# Patient Record
Sex: Female | Born: 1960 | Race: Asian | Hispanic: No | Marital: Married | Smoking: Never smoker
Health system: Southern US, Community
[De-identification: ages and names within clinical notes are randomized; demographics above are authoritative.]

## PROBLEM LIST (undated history)

## (undated) DIAGNOSIS — G43909 Migraine, unspecified, not intractable, without status migrainosus: Secondary | ICD-10-CM

## (undated) DIAGNOSIS — K219 Gastro-esophageal reflux disease without esophagitis: Secondary | ICD-10-CM

## (undated) HISTORY — PX: TOOTH EXTRACTION: SUR596

## (undated) HISTORY — DX: Migraine, unspecified, not intractable, without status migrainosus: G43.909

## (undated) HISTORY — DX: Gastro-esophageal reflux disease without esophagitis: K21.9

---

## 2016-04-09 DIAGNOSIS — M542 Cervicalgia: Secondary | ICD-10-CM | POA: Insufficient documentation

## 2016-04-09 DIAGNOSIS — Z8719 Personal history of other diseases of the digestive system: Secondary | ICD-10-CM | POA: Insufficient documentation

## 2016-04-09 DIAGNOSIS — M25512 Pain in left shoulder: Secondary | ICD-10-CM | POA: Insufficient documentation

## 2016-04-09 HISTORY — DX: Pain in left shoulder: M25.512

## 2016-10-09 DIAGNOSIS — M7502 Adhesive capsulitis of left shoulder: Secondary | ICD-10-CM

## 2016-10-09 HISTORY — DX: Adhesive capsulitis of left shoulder: M75.02

## 2019-03-04 DIAGNOSIS — D259 Leiomyoma of uterus, unspecified: Secondary | ICD-10-CM | POA: Insufficient documentation

## 2019-03-04 DIAGNOSIS — K219 Gastro-esophageal reflux disease without esophagitis: Secondary | ICD-10-CM | POA: Insufficient documentation

## 2019-03-04 DIAGNOSIS — K5792 Diverticulitis of intestine, part unspecified, without perforation or abscess without bleeding: Secondary | ICD-10-CM | POA: Insufficient documentation

## 2019-03-04 DIAGNOSIS — E559 Vitamin D deficiency, unspecified: Secondary | ICD-10-CM | POA: Insufficient documentation

## 2019-03-04 DIAGNOSIS — K635 Polyp of colon: Secondary | ICD-10-CM | POA: Insufficient documentation

## 2019-03-04 DIAGNOSIS — Z9109 Other allergy status, other than to drugs and biological substances: Secondary | ICD-10-CM | POA: Insufficient documentation

## 2020-05-09 ENCOUNTER — Ambulatory Visit: Payer: 59 | Admitting: Family Medicine

## 2020-05-09 ENCOUNTER — Encounter: Payer: Self-pay | Admitting: Family Medicine

## 2020-05-09 ENCOUNTER — Other Ambulatory Visit: Payer: Self-pay

## 2020-05-09 VITALS — BP 110/70 | HR 84 | Ht 61.0 in | Wt 112.0 lb

## 2020-05-09 DIAGNOSIS — G43809 Other migraine, not intractable, without status migrainosus: Secondary | ICD-10-CM | POA: Diagnosis not present

## 2020-05-09 DIAGNOSIS — Z0000 Encounter for general adult medical examination without abnormal findings: Secondary | ICD-10-CM

## 2020-05-09 DIAGNOSIS — Z13220 Encounter for screening for lipoid disorders: Secondary | ICD-10-CM

## 2020-05-09 DIAGNOSIS — G43909 Migraine, unspecified, not intractable, without status migrainosus: Secondary | ICD-10-CM | POA: Insufficient documentation

## 2020-05-09 NOTE — Progress Notes (Signed)
Annual Exam   Chief Complaint:  Chief Complaint  Patient presents with  . Establish Care    History of Present Illness:  Ms. Diana Reyes is a 59 y.o. [id number] who LMP was No LMP recorded. Patient is postmenopausal., presents today for her annual examination.     Nutrition She does get adequate calcium and Vitamin D in her diet. Diet: varied, balanced Exercise: walking daily  Safety The patient wears seatbelts: yes.     The patient feels safe at home and in their relationships: yes.   Menstrual Post-menopausal Symptoms improving - hot flashes less often  GYN She is not sexually active.    Cervical Cancer Screening:   Last Pap:  Year ago No hx of abnormal Declines today  Breast Cancer Screening There is no FH of breast cancer. There is no FH of ovarian cancer. BRCA screening Not Indicated.  Last Mammogram: 2017 The patient does want a mammogram this year.    Colon Cancer Screening Age 68-75 yo - benefits outweigh the risk. Adults 42-85 yo who have never been screened benefit.  Benefits: 134000 people in 2016 will be diagnosed and 49,000 will die - early detection helps [place]: Complications 2/2 to colonoscopy High Risk (Colonoscopy): genetic disorder (Lynch syndrome or familial adenomatous polyposis), personal hx of IBD, previous adenomatous polyp, or previous colorectal cancer, FamHx start 10 years before the age at diagnosis, increased in males and black race  Options:  FIT - looks for hemoglobin (blood in the stool) - specific and fairly sensitive - must be done annually Cologuard - looks for DNA and blood - more sensitive - therefore can have more false positives, every 3 years Colonoscopy - every 10 years if normal - sedation, bowl prep, must have someone drive you  Shared decision making and the patient had decided to do up to date on colonscopy will repeat in 2026.   Lung Cancer Screening Annual screening for adults age 45-80 yo with 30 year pack history?  No Current Tobacco user? No Quit less than 15 years ago? No Interested in low dose CT for lung cancer screening? not applicable  Weight Wt Readings from Last 3 Encounters:  05/09/20 112 lb (50.8 kg)   Patient has normal BMI  BMI Readings from Last 1 Encounters:  05/09/20 21.16 kg/m     Chronic disease screening Blood pressure monitoring:  BP Readings from Last 3 Encounters:  05/09/20 110/70    Lipid Monitoring: Indication for screening: age >105, obesity, diabetes, family hx, CV risk factors.  Lipid screening: Yes  No results found for: CHOL, HDL, LDLCALC, LDLDIRECT, TRIG, CHOLHDL   Diabetes Screening: age >81, overweight, family hx, PCOS, hx of gestational diabetes, at risk ethnicity Diabetes Screening screening: Yes  No results found for: HGBA1C   Past Medical History:  Diagnosis Date  . Acute pain of left shoulder 04/09/2016  . Adhesive capsulitis of left shoulder 10/09/2016  . Chronic GERD   . Migraines     Past Surgical History:  Procedure Laterality Date  . TOOTH EXTRACTION      Prior to Admission medications   Medication Sig Start Date End Date Taking? Authorizing Provider  Calcium Carb-Cholecalciferol (CALCIUM/VITAMIN D PO) Take by mouth.   Yes [provider]  cetirizine (ZYRTEC) 10 MG tablet Take by mouth.   Yes [provider]  ibuprofen (ADVIL) 600 MG tablet Take 600 mg by mouth as needed.   Yes [provider]  Multiple Vitamin (MULTI-VITAMIN) tablet Take 1 tablet by mouth  daily.   Yes [provider]  omeprazole (PRILOSEC) 40 MG capsule Take by mouth as needed. 04/11/16  Yes [provider]    Allergies  Allergen Reactions  . Other Rash    Mild itchy rash occasionally with seafood    Gynecologic History: No LMP recorded. Patient is postmenopausal.  Obstetric History: [id number]  Social History   Socioeconomic History  . Marital status: Married    Spouse name: Jeris Penta  . Number of children: 2  .  Years of education: college  . Highest education level: Not on file  Occupational History  . Not on file  Tobacco Use  . Smoking status: Never Smoker  . Smokeless tobacco: Never Used  Vaping Use  . Vaping Use: Never used  Substance and Sexual Activity  . Alcohol use: Yes    Comment: 1-2 times a week, less than a serving size  . Drug use: Never  . Sexual activity: Not Currently    Partners: Male    Birth control/protection: None  Other Topics Concern  . Not on file  Social History Narrative   05/09/20   From: [place], moved to [place] to be near family in 2007   Living: with husband Jeris Penta (1991) and daughter Gwenette Greet   Work: retired Equities trader from [place]      Family: Mitzi Hansen (lives in [place]) and daughter (maureen, [employer]-g)      Enjoys: reading bible, crochet, watch tv      Exercise: walking 10-20 minutes daily (dog walking, minipoodle)   Diet: pretty healthy, balanaced      Safety   Seat belts: Yes    Guns: Yes  and secure   Safe in relationships: Yes    Social Determinants of Health   Financial Resource Strain:   . Difficulty of Paying Living Expenses:   Food Insecurity:   . Worried About Charity fundraiser in the Last Year:   . Arboriculturist in the Last Year:   Transportation Needs:   . Film/video editor (Medical):   Marland Kitchen Lack of Transportation (Non-Medical):   Physical Activity:   . Days of Exercise per Week:   . Minutes of Exercise per Session:   Stress:   . Feeling of Stress :   Social Connections:   . Frequency of Communication with Friends and Family:   . Frequency of Social Gatherings with Friends and Family:   . Attends Religious Services:   . Active Member of Clubs or Organizations:   . Attends Archivist Meetings:   Marland Kitchen Marital Status:   Intimate Partner Violence:   . Fear of Current or Ex-Partner:   . Emotionally Abused:   Marland Kitchen Physically Abused:   . Sexually Abused:     Family History  Problem Relation Age of Onset   . Stroke Mother   . Heart disease Father   . Asthma Daughter   . Asthma Maternal Grandmother   . Hearing loss Maternal Grandfather   . Pancreatic cancer Maternal Grandfather     Review of Systems  Constitutional: Negative for chills and fever.  HENT: Negative for congestion and sore throat.   Eyes: Negative for blurred vision and double vision.  Respiratory: Negative for shortness of breath.   Cardiovascular: Negative for chest pain.  Gastrointestinal: Negative for heartburn, nausea and vomiting.  Genitourinary: Negative.   Musculoskeletal: Negative.  Negative for myalgias.  Skin: Negative for rash.  Neurological: Negative for dizziness and headaches.  Endo/Heme/Allergies: Does not  bruise/bleed easily.  Psychiatric/Behavioral: Negative for depression. The patient is not nervous/anxious.      Physical Exam BP 110/70   Pulse 84   Ht _0  (1.549 m)   Wt 112 lb (50.8 kg)   SpO2 96%   BMI 21.16 kg/m    BP Readings from Last 3 Encounters:  05/09/20 110/70      Physical Exam Constitutional:      General: She is not in acute distress.    Appearance: She is well-developed. She is not diaphoretic.  HENT:     Head: Normocephalic and atraumatic.     Right Ear: Tympanic membrane and external ear normal.     Left Ear: Tympanic membrane and external ear normal.     Nose: Nose normal.     Mouth/Throat:     Mouth: Mucous membranes are moist.     Pharynx: No posterior oropharyngeal erythema.  Eyes:     General: No scleral icterus.    Conjunctiva/sclera: Conjunctivae normal.  Cardiovascular:     Rate and Rhythm: Normal rate and regular rhythm.     Heart sounds: No murmur heard.   Pulmonary:     Effort: Pulmonary effort is normal. No respiratory distress.     Breath sounds: Normal breath sounds. No wheezing.  Abdominal:     General: Bowel sounds are normal. There is no distension.     Palpations: Abdomen is soft. There is no mass.     Tenderness: There is no abdominal  tenderness. There is no guarding or rebound.  Musculoskeletal:        General: Normal range of motion.     Cervical back: Neck supple.  Lymphadenopathy:     Cervical: No cervical adenopathy.  Skin:    General: Skin is warm and dry.     Capillary Refill: Capillary refill takes less than 2 seconds.  Neurological:     Mental Status: She is alert and oriented to person, place, and time.     Deep Tendon Reflexes: Reflexes normal.  Psychiatric:        Mood and Affect: Mood normal.        Behavior: Behavior normal.         Assessment: 59 y.o. [id number] female here for routine annual physical examination.  Plan: Problem List Items Addressed This Visit      Cardiovascular and Mediastinum   Migraine    Continue prn advil       Relevant Medications   ibuprofen (ADVIL) 600 MG tablet    Other Visit Diagnoses    Annual physical exam    -  Primary   Relevant Orders   Lipid panel   Basic metabolic panel   Screening for hypercholesterolemia       Relevant Orders   Lipid panel      Screening: -- Blood pressure screen normal -- cholesterol screening: will obtain -- Weight screening: normal -- Diabetes Screening: will obtain -- Nutrition: normal - encouraged healthy eating  The ASCVD Risk score Mikey Bussing DC Jr., et al., 2013) failed to calculate for the following reasons:   Cannot find a previous HDL lab   Cannot find a previous total cholesterol lab  -- Statin therapy for Age 93-75 with CVD risk >7.5%  Psych -- Depression screening (PHQ-9): Low risk   Safety -- tobacco screening: not using -- alcohol screening:  low-risk usage. -- no evidence of domestic violence or intimate partner violence.   Cancer Screening -- pap smear - declined today. Advised f/u --  family history of breast cancer screening: done. not at high risk. -- Mammogram - information provided to schedule with [facility]  Immunizations -- flu vaccine up to date -- TDAP q10 years up to  date   Encouraged regular vision/dental care. Continue exercise.   Return for PAP.    Lesleigh Noe, MD

## 2020-05-09 NOTE — Patient Instructions (Addendum)
Schedule fasting lab appointment  Add strength training to help with muscles  Please call the location of your choice from the menu below to schedule your Mammogram and/or Bone Density appointment.     [place]  [address] at [facility]   Phone:  [phone]   [place], [place] [address]                                            Services: 3D Mammogram and Bone Density  Migraine Headache  What are the causes? The exact cause of this condition is not known. However, a migraine may be caused when nerves in the brain become irritated and release chemicals that cause inflammation of blood vessels. This inflammation causes pain. This condition may be triggered or caused by:  Drinking alcohol.  Smoking.  Taking medicines, such as: ? Medicine used to treat chest pain (nitroglycerin). ? Birth control pills. ? Estrogen. ? Certain blood pressure medicines.  Eating or drinking products that contain nitrates, glutamate, aspartame, or tyramine. Aged cheeses, chocolate, or caffeine may also be triggers.  Doing physical activity. Other things that may trigger a migraine headache include:  Menstruation.  Pregnancy.  Hunger.  Stress.  Lack of sleep or too much sleep.  Weather changes.  Fatigue.

## 2020-05-09 NOTE — Assessment & Plan Note (Signed)
Continue prn advil

## 2020-08-08 ENCOUNTER — Other Ambulatory Visit: Payer: Self-pay

## 2020-08-08 ENCOUNTER — Emergency Department: Payer: 59

## 2020-08-08 ENCOUNTER — Encounter: Payer: Self-pay | Admitting: Emergency Medicine

## 2020-08-08 ENCOUNTER — Emergency Department
Admission: EM | Admit: 2020-08-08 | Discharge: 2020-08-08 | Disposition: A | Discharge status: Home / Self Care | Payer: 59 | Attending: Emergency Medicine | Admitting: Emergency Medicine

## 2020-08-08 DIAGNOSIS — N200 Calculus of kidney: Secondary | ICD-10-CM | POA: Insufficient documentation

## 2020-08-08 DIAGNOSIS — Z86010 Personal history of colonic polyps: Secondary | ICD-10-CM | POA: Diagnosis not present

## 2020-08-08 DIAGNOSIS — R109 Unspecified abdominal pain: Secondary | ICD-10-CM

## 2020-08-08 LAB — COMPREHENSIVE METABOLIC PANEL
ALT: 28 U/L (ref 0–44)
AST: 27 U/L (ref 15–41)
Albumin: 4.1 g/dL (ref 3.5–5.0)
Alkaline Phosphatase: 60 U/L (ref 38–126)
Anion gap: 12 (ref 5–15)
BUN: 20 mg/dL (ref 6–20)
CO2: 23 mmol/L (ref 22–32)
Calcium: 9 mg/dL (ref 8.9–10.3)
Chloride: 105 mmol/L (ref 98–111)
Creatinine, Ser: 0.79 mg/dL (ref 0.44–1.00)
GFR calc Af Amer: >60 mL/min (ref >60)
GFR calc non Af Amer: >60 mL/min (ref >60)
Glucose, Bld: 122 mg/dL — ABNORMAL HIGH (ref 70–99)
Potassium: 4 mmol/L (ref 3.5–5.1)
Sodium: 140 mmol/L (ref 135–145)
Total Bilirubin: 0.7 mg/dL (ref 0.3–1.2)
Total Protein: 6.7 g/dL (ref 6.5–8.1)

## 2020-08-08 LAB — CBC WITH DIFFERENTIAL/PLATELET
Abs Immature Granulocytes: 0.02 10*3/uL (ref 0.00–0.07)
Basophils Absolute: 0.1 10*3/uL (ref 0.0–0.1)
Basophils Relative: 1 %
Eosinophils Absolute: 0.1 10*3/uL (ref 0.0–0.5)
Eosinophils Relative: 1 %
HCT: 37.5 % (ref 36.0–46.0)
Hemoglobin: 12.6 g/dL (ref 12.0–15.0)
Immature Granulocytes: 0 %
Lymphocytes Relative: 37 %
Lymphs Abs: 3.6 10*3/uL (ref 0.7–4.0)
MCH: 29.6 pg (ref 26.0–34.0)
MCHC: 33.6 g/dL (ref 30.0–36.0)
MCV: 88 fL (ref 80.0–100.0)
Monocytes Absolute: 0.5 10*3/uL (ref 0.1–1.0)
Monocytes Relative: 6 %
Neutro Abs: 5.4 10*3/uL (ref 1.7–7.7)
Neutrophils Relative %: 55 %
Platelets: 247 10*3/uL (ref 150–400)
RBC: 4.26 MIL/uL (ref 3.87–5.11)
RDW: 12.6 % (ref 11.5–15.5)
WBC: 9.7 10*3/uL (ref 4.0–10.5)
nRBC: 0 % (ref 0.0–0.2)

## 2020-08-08 LAB — URINALYSIS, COMPLETE (UACMP) WITH MICROSCOPIC
Bacteria, UA: NONE SEEN
Bilirubin Urine: NEGATIVE
Glucose, UA: NEGATIVE mg/dL
Ketones, ur: NEGATIVE mg/dL
Leukocytes,Ua: NEGATIVE
Nitrite: NEGATIVE
Protein, ur: NEGATIVE mg/dL
Specific Gravity, Urine: 1.026 (ref 1.005–1.030)
pH: 5 (ref 5.0–8.0)

## 2020-08-08 LAB — LIPASE, BLOOD: Lipase: 48 U/L (ref 11–51)

## 2020-08-08 MED ORDER — ONDANSETRON HCL 4 MG/2ML IJ SOLN
INTRAMUSCULAR | Status: AC
  Administered 2020-08-08: 4 mg via INTRAVENOUS
  Filled 2020-08-08: qty 2

## 2020-08-08 MED ORDER — SUCRALFATE 1 G PO TABS
1.0000 g | ORAL_TABLET | Freq: Once | ORAL | Status: AC
  Administered 2020-08-08: 1 g via ORAL
  Filled 2020-08-08: qty 1

## 2020-08-08 MED ORDER — MORPHINE SULFATE (PF) 4 MG/ML IV SOLN: 4.0000 mg | Freq: Once | INTRAVENOUS | Status: AC

## 2020-08-08 MED ORDER — KETOROLAC TROMETHAMINE 30 MG/ML IJ SOLN
30.0000 mg | Freq: Once | INTRAMUSCULAR | Status: AC
  Administered 2020-08-08: 30 mg via INTRAVENOUS
  Filled 2020-08-08: qty 1

## 2020-08-08 MED ORDER — MORPHINE SULFATE (PF) 4 MG/ML IV SOLN
INTRAVENOUS | Status: AC
  Administered 2020-08-08: 4 mg via INTRAVENOUS
  Filled 2020-08-08: qty 1

## 2020-08-08 MED ORDER — TAMSULOSIN HCL 0.4 MG PO CAPS
0.4000 mg | ORAL_CAPSULE | Freq: Once | ORAL | Status: AC
  Administered 2020-08-08: 0.4 mg via ORAL
  Filled 2020-08-08: qty 1

## 2020-08-08 MED ORDER — ONDANSETRON HCL 4 MG/2ML IJ SOLN: 4.0000 mg | Freq: Once | INTRAMUSCULAR | Status: AC

## 2020-08-08 MED ORDER — HYDROCODONE-ACETAMINOPHEN 5-325 MG PO TABS
2.0000 | ORAL_TABLET | Freq: Once | ORAL | Status: AC
  Administered 2020-08-08: 2 via ORAL
  Filled 2020-08-08: qty 2

## 2020-08-08 MED ORDER — HYDROCODONE-ACETAMINOPHEN 5-325 MG PO TABS
2.0000 | ORAL_TABLET | Freq: Four times a day (QID) | ORAL | 0 refills | Status: DC | PRN
Start: 2020-08-08 — End: 2021-02-26

## 2020-08-08 MED ORDER — SODIUM CHLORIDE 0.9 % IV BOLUS
1000.0000 mL | Freq: Once | INTRAVENOUS | Status: AC
  Administered 2020-08-08: 1000 mL via INTRAVENOUS

## 2020-08-08 NOTE — ED Notes (Signed)
Pt transported to CT ?

## 2020-08-08 NOTE — ED Triage Notes (Signed)
Pt to triage via w/c with no distress noted; st since 2am having lower abd pain radiating around into back with no accomp symptoms

## 2020-08-08 NOTE — ED Notes (Signed)
Patient discharged home to wife, patient received discharge papers and prescription. Patient appropriate and cooperative. Vital signs taken. NAD noted.

## 2020-08-08 NOTE — ED Provider Notes (Signed)
[facility] Emergency Department Provider Note   ____________________________________________   First MD Initiated Contact with Patient 08/08/20 0700     (approximate)  I have reviewed the triage vital signs and the nursing notes.   HISTORY  Chief Complaint Abdominal Pain    HPI Diana Reyes is a 59 y.o. female with a past medical history of migraines who presents for acute onset left flank pain that began approximately 2 AM.  Patient states that this pain is waxing and waning and 10/10 at its worst that eases up to approximately 6/10 in 20-30 minutes.  Patient endorses associated nausea without vomiting.  Patient denies any pain similar to this in the past.  Patient has taken some ibuprofen with little relief.  Patient denies any exacerbating factors.         Past Medical History:  Diagnosis Date  . Acute pain of left shoulder 04/09/2016  . Adhesive capsulitis of left shoulder 10/09/2016  . Chronic GERD   . Migraines     Patient Active Problem List   Diagnosis Date Noted  . Migraine 05/09/2020  . Colon polyp 03/04/2019  . Diverticulitis 03/04/2019  . Environmental allergies 03/04/2019  . GERD (gastroesophageal reflux disease) 03/04/2019  . Uterine fibroid 03/04/2019  . Vitamin D deficiency 03/04/2019  . Cervicalgia 04/09/2016    Past Surgical History:  Procedure Laterality Date  . TOOTH EXTRACTION      Prior to Admission medications   Medication Sig Start Date End Date Taking? Authorizing Provider  Calcium Carb-Cholecalciferol (CALCIUM/VITAMIN D PO) Take by mouth.    [provider]  cetirizine (ZYRTEC) 10 MG tablet Take by mouth.    [provider]  HYDROcodone-acetaminophen (NORCO) 5-325 MG tablet Take 2 tablets by mouth every 6 (six) hours as needed for moderate pain. 08/08/20   Naaman Plummer, MD  ibuprofen (ADVIL) 600 MG tablet Take 600 mg by mouth as needed.    [provider]  Multiple Vitamin  (MULTI-VITAMIN) tablet Take 1 tablet by mouth daily.    [provider]  omeprazole (PRILOSEC) 40 MG capsule Take by mouth as needed. 04/11/16   [provider]    Allergies Other  Family History  Problem Relation Age of Onset  . Stroke Mother   . Heart disease Father   . Asthma Daughter   . Asthma Maternal Grandmother   . Hearing loss Maternal Grandfather   . Pancreatic cancer Maternal Grandfather     Social History Social History   Tobacco Use  . Smoking status: Never Smoker  . Smokeless tobacco: Never Used  Vaping Use  . Vaping Use: Never used  Substance Use Topics  . Alcohol use: Yes    Comment: 1-2 times a week, less than a serving size  . Drug use: Never    Review of Systems  Constitutional: No fever/chills Eyes: No visual changes. ENT: No sore throat. Cardiovascular: Denies chest pain. Respiratory: Denies shortness of breath. Gastrointestinal: Endorses abdominal pain.  No nausea, no vomiting.  No diarrhea. Genitourinary: Negative for dysuria. Musculoskeletal: Negative for acute arthralgias Skin: Negative for rash. Neurological: Negative for headaches, weakness/numbness/paresthesias in any extremity Psychiatric: Negative for suicidal ideation/homicidal ideation   ____________________________________________   PHYSICAL EXAM:  VITAL SIGNS: ED Triage Vitals  Enc Vitals Group     BP 08/08/20 0513 115/68     Pulse Rate 08/08/20 0513 69     Resp 08/08/20 0513 20     Temp 08/08/20 0513 98.7 F (37.1 C)  Temp Source 08/08/20 0513 Oral     SpO2 08/08/20 0513 97 %     Weight 08/08/20 0521 110 lb (49.9 kg)     Height 08/08/20 0521 5\' 2"  (1.575 m)     Head Circumference --      Peak Flow --      Pain Score 08/08/20 0521 10     Pain Loc --      Pain Edu? --      Excl. in [place]? --     Constitutional: Alert and oriented. Well appearing and in no acute distress. Eyes: Conjunctivae are normal. PERRL. EOMI. Head: Atraumatic. Nose: No  congestion/rhinnorhea. Mouth/Throat: Mucous membranes are moist. Neck: No stridor Cardiovascular: Normal rate, regular rhythm. Grossly normal heart sounds.  Good peripheral circulation. Respiratory: Normal respiratory effort.  No retractions. Gastrointestinal: Soft and nontender. No distention.  Left CVA tenderness to percussion Musculoskeletal: No lower extremity tenderness nor edema.  No joint effusions. Neurologic:  Normal speech and language. No gross focal neurologic deficits are appreciated. Skin:  Skin is warm and dry. No rash noted. Psychiatric: Mood and affect are normal. Speech and behavior are normal.  ____________________________________________   LABS (all labs ordered are listed, but only abnormal results are displayed)  Labs Reviewed  COMPREHENSIVE METABOLIC PANEL - Abnormal; Notable for the following components:      Result Value   Glucose, Bld 122 (*)    All other components within normal limits  URINALYSIS, COMPLETE (UACMP) WITH MICROSCOPIC - Abnormal; Notable for the following components:   Color, Urine YELLOW (*)    APPearance CLEAR (*)    Hgb urine dipstick SMALL (*)    All other components within normal limits  CBC WITH DIFFERENTIAL/PLATELET  LIPASE, BLOOD   ____________________________________________ ____________________________  RADIOLOGY  ED MD interpretation: CT of the abdomen and pelvis shows left kidney stone at the UVJ that is 4 x 3 mm  Official radiology report(s): CT Renal Stone Study  Result Date: 08/08/2020 CLINICAL DATA:  Flank pain with kidney stone suspected EXAM: CT ABDOMEN AND PELVIS WITHOUT CONTRAST TECHNIQUE: Multidetector CT imaging of the abdomen and pelvis was performed following the standard protocol without IV contrast. COMPARISON:  None. FINDINGS: Lower chest:  No contributory findings. Hepatobiliary: No focal liver abnormality.No evidence of biliary obstruction or stone. Pancreas: Unremarkable. Spleen: Unremarkable.  Adrenals/Urinary Tract: Negative adrenals. Left hydroureteronephrosis and perinephric stranding due to a 4 x 3 mm stone at the UVJ. No additional urolithiasis. Unremarkable bladder. Stomach/Bowel:  No obstruction. Negative for bowel inflammation. Vascular/Lymphatic: No acute vascular abnormality. No mass or adenopathy. Reproductive:I soda hyperdense uterine masses with lobulated serosa. The most discrete is anterior subserosal at 3.3 cm. Other: No ascites or pneumoperitoneum. Musculoskeletal: No acute abnormalities. IMPRESSION: 1. Left hydroureteronephrosis from a 4 x 3 mm UVJ calculus. 2. Fibroid uterus. Electronically Signed   By: Monte Fantasia M.D.   On: 08/08/2020 07:21    ____________________________________________   PROCEDURES  Procedure(s) performed (including Critical Care):  Procedures   ____________________________________________   INITIAL IMPRESSION / ASSESSMENT AND PLAN / ED COURSE        Patient presents for severe flank pain. Presentation most consistent with Renal Colic from a Non-infected Kidney Stone. Given History and Exam I have lower suspicion for atypical appendicitis, genital torsion, acute cholecystitis, AAA, Aortic Dissection, Serious Bacterial Illness or other emergent intraabdominal pathology.  Workup: CBC, BMP, CT Abd/Pelvis noncontrast, UA, reassess Findings: 4 x 3 mm left UVJ stone UA shows no signs of infection Reassesment: Patient  tolerating PO and pain controlled Disposition:  Discharge. Strict return precautions for infected stone or PO intolerance discussed.      ____________________________________________   FINAL CLINICAL IMPRESSION(S) / ED DIAGNOSES  Final diagnoses:  Kidney stone on left side  Acute left flank pain     ED Discharge Orders         Ordered    HYDROcodone-acetaminophen (NORCO) 5-325 MG tablet  Every 6 hours PRN        08/08/20 0859           Note:  This document was prepared using Dragon voice recognition  software and may include unintentional dictation errors.   Naaman Plummer, MD 08/08/20 1054

## 2020-08-11 ENCOUNTER — Telehealth: Payer: 59 | Admitting: Family Medicine

## 2021-02-22 ENCOUNTER — Ambulatory Visit: Payer: 59 | Admitting: Family Medicine

## 2021-02-23 ENCOUNTER — Telehealth: Payer: Self-pay

## 2021-02-23 NOTE — Telephone Encounter (Signed)
Noted. Could try heartburn medicine but agree if worsening she should be evaluated.

## 2021-02-23 NOTE — Telephone Encounter (Signed)
I spoke with pt; pt does not want to go to ED or UC and pt does not feel in any distress right now. Pt wants sooner appt with Dr Einar Pheasant than 03/01/21. appt was changed from 03/01/21 to 02/26/21 at 4:20 with Dr Einar Pheasant. Pt did agree if pt condition changes, worsens or does not get better pt will go to UC over weekend. Sending note to Dr Einar Pheasant who is out of office and Avie Echevaria NP.

## 2021-02-23 NOTE — Telephone Encounter (Signed)
[facility] - Client TELEPHONE ADVICE RECORD [facility] Patient Name: Diana Reyes Gender: Female DOB: [birth year] Age: 60 Y 73 M Return Phone Number: [phone] (Primary), [phone] (Secondary) Address: City/ State/ Zip: [place] [address] Client [facility] - Client Client Site [facility] - Reyes Physician Waunita Schooner- MD Contact Type Call Who Is Calling Patient / Member / Family / Caregiver Call Type Triage / Clinical Relationship To Patient Self Return Phone Number [phone] (Primary) Chief Complaint CHEST PAIN - pain, pressure, heaviness or tightness Reason for Call Symptomatic / Request for [facility] is calling from provider. Caller states patient is having tighting in chest, pinching feeling. She believes it is related to her allergies. Tightness has been for over a week. Translation No Nurse Assessment Nurse: Raphael Gibney, RN, Vanita Ingles Date/Time (Eastern Time): 02/23/2021 8:48:59 AM Confirm and document reason for call. If symptomatic, describe symptoms. ---Caller states she has chest tightness in the middle of her chest. Has had tightness over a week. Feels like she has something in her throat when she swallows. Does the patient have any new or worsening symptoms? ---Yes Will a triage be completed? ---Yes Related visit to physician within the last 2 weeks? ---No Does the PT have any chronic conditions? (i.e. diabetes, asthma, this includes High risk factors for pregnancy, etc.) ---Yes List chronic conditions. ---allergies Is this a behavioral health or substance abuse call? ---No Guidelines Guideline Title Affirmed Question Affirmed Notes Nurse Date/Time (Eastern Time) Chest Pain [1] Chest pain lasts > 5 minutes AND [2] occurred in past 3 days (72 hours) (Exception: feels exactly the same as previously diagnosed heartburn and has Raphael Gibney,  Therapist, sports, Vanita Ingles 02/23/2021 8:51:59 AM PLEASE NOTE: All timestamps contained within this report are represented as [place] Standard Time. CONFIDENTIALTY NOTICE: This fax transmission is intended only for the addressee. It contains information that is legally privileged, confidential or otherwise protected from use or disclosure. If you are not the intended recipient, you are strictly prohibited from reviewing, disclosing, copying using or disseminating any of this information or taking any action in reliance on or regarding this information. If you have received this fax in error, please notify us immediately by telephone so that we can arrange for its return to [place]. Phone: [phone], Toll-Free: [phone], Fax: [phone] Page: 2 of 4 Call Id: [id number] Guidelines Guideline Title Affirmed Question Affirmed Notes Nurse Date/Time Eilene Ghazi Time) accompanying sour taste in mouth) Disp. Time Eilene Ghazi Time) Disposition Final User 02/23/2021 8:47:49 AM Send to Urgent Queue Iver Nestle 02/23/2021 8:57:05 AM Go to ED Now (or PCP triage) Yes Raphael Gibney, RN, Doreatha Lew Disagree/Comply Disagree Caller Understands Yes PreDisposition Call Doctor Care Advice Given Per Guideline * IF NO PCP (PRIMARY CARE PROVIDER) SECOND-LEVEL TRIAGE: You need to be seen within the next hour. Go to the [facility] at _____________ [place] as soon as you can. GO TO ED NOW (OR PCP TRIAGE): CARE ADVICE given per Chest Pain (Adult) guideline. CALL EMS IF: * You become worse * Passes out or becomes too weak to stand * Severe difficulty breathing occurs Comments User: Dannielle Burn, RN Date/Time (Eastern Time): 02/23/2021 8:56:42 AM pt does not want to go to ER or UC. she wants to make appt User: Dannielle Burn, RN Date/Time Eilene Ghazi Time): 02/23/2021 9:01:06 AM Called back line and spoke to alice and gave report that pt has had chest tightness for over a week. Having a little difficulty taking a deep  breath. Feels like  something is in he throat when she swallows. Trige outcome of go to ER now (or PCP triage). Pt wants appt. states no appts are available for today and she will let nurse now User: Dannielle Burn, RN Date/Time Eilene Ghazi Time): 02/23/2021 9:01:54 AM Called back line and spoke to alice and gave report that pt has had chest tightness for over a week. Having a little difficulty taking a deep breath. Feels like something is in he throat when she swallows. Trige outcome of go to ER now (or PCP triage). Pt wants appt. states no appts are available for today and she will let nurse now. pt notified and verbalized understanding Referrals [facility] REFUSED Triage Details User: Pryor Montes Date/Time: 02/23/2021 8:51:59 AM Triage Guideline: Chest Pain PLEASE NOTE: All timestamps contained within this report are represented as [place] Standard Time. CONFIDENTIALTY NOTICE: This fax transmission is intended only for the addressee. It contains information that is legally privileged, confidential or otherwise protected from use or disclosure. If you are not the intended recipient, you are strictly prohibited from reviewing, disclosing, copying using or disseminating any of this information or taking any action in reliance on or regarding this information. If you have received this fax in error, please notify us immediately by telephone so that we can arrange for its return to [place]. Phone: [phone], Toll-Free: [phone], Fax: [phone] Page: 3 of 4 Call Id: [id number] Triage Details SEVERE difficulty breathing (e.g., struggling for each breath, speaks in single words) -----No Difficult to awaken or acting confused (e.g., disoriented, slurred speech) -----No Shock suspected (e.g., cold/pale/clammy skin, too weak to stand, low BP, rapid pulse) -----No Passed out (i.e., lost consciousness, collapsed and was not responding) -----No [1] Chest pain lasts > 5 minutes AND [2] age > 47 -----No [1]  Chest pain lasts > 5 minutes AND [2] age > 38 AND [3] one or more cardiac risk factors (e.g., diabetes, high blood pressure, high cholesterol, smoker, or strong family history of heart disease) -----No [1] Chest pain lasts > 5 minutes AND [2] history of heart disease (i.e., angina, heart attack, heart failure, bypass surgery, takes nitroglycerin) -----No [1] Chest pain lasts > 5 minutes AND [2] described as crushing, pressure-like, or heavy -----No Heart beating < 50 beats per minute OR > 140 beats per minute -----No Visible sweat on face or sweat dripping down face -----No Sounds like a life-threatening emergency to the triager -----No Followed a chest injury -----No SEVERE chest pain -----No [1] Chest pain (or "angina") comes and goes AND [2] is happening more often (increasing in frequency) or getting worse (increasing in severity) (Exception: chest pains that last only a few seconds) -----No Pain also in shoulder(s) or arm(s) or jaw (Exception: pain is clearly made worse by movement) -----No Difficulty breathing -----No Dizziness or lightheadedness PLEASE NOTE: All timestamps contained within this report are represented as [place] Standard Time. CONFIDENTIALTY NOTICE: This fax transmission is intended only for the addressee. It contains information that is legally privileged, confidential or otherwise protected from use or disclosure. If you are not the intended recipient, you are strictly prohibited from reviewing, disclosing, copying using or disseminating any of this information or taking any action in reliance on or regarding this information. If you have received this fax in error, please notify us immediately by telephone so that we can arrange for its return to [place]. Phone: [phone], Toll-Free: [phone], Fax: [phone] Page: 4 of 4 Call Id: [id number] Triage Details -----No Coughing up  blood -----No Cocaine use within last 3 days -----No Major surgery in past  month -----No Hip or leg fracture (broken bone) in past month (or had cast on leg or ankle in past month) -----No Illness requiring prolonged bedrest in past month (e.g., immobilization, long hospital stay) -----No Long-distance travel in past month (e.g., car, bus, train, plane; with trip lasting 6 or more hours) -----No History of prior "blood clot" in leg or lungs (i.e., deep vein thrombosis, pulmonary embolism) -----No History of inherited increased risk of blood clots (e.g., Factor 5 Leiden, Anti-thrombin 3, Protein C or Protein S deficiency, Prothrombin mutation) -----No Cancer treatment in past six months (or has cancer now) -----No [1] Chest pain lasts > 5 minutes AND [2] occurred in past 3 days (72 hours) (Exception: feels exactly the same as previously diagnosed heartburn and has accompanying sour taste in mouth) -----Yes Disposition: Go to ED Now (or PCP triage) * IF NO PCP (PRIMARY CARE PROVIDER) SECOND-LEVEL TRIAGE: You need to be seen within the next hour. Go to the [facility] at _____________ [place] as soon as you can. GO TO ED NOW (OR PCP TRIAGE): CARE ADVICE given per Chest Pain (Adult) guideline. CALL EMS IF: * You become worse * Passes out or becomes too weak to stand * Severe difficulty breathing occurs

## 2021-02-26 ENCOUNTER — Other Ambulatory Visit: Payer: Self-pay

## 2021-02-26 ENCOUNTER — Encounter: Payer: Self-pay | Admitting: Family Medicine

## 2021-02-26 ENCOUNTER — Ambulatory Visit (INDEPENDENT_AMBULATORY_CARE_PROVIDER_SITE_OTHER): Payer: 59 | Admitting: Family Medicine

## 2021-02-26 VITALS — BP 110/62 | HR 84 | Temp 97.3°F | Ht 62.0 in | Wt 108.0 lb

## 2021-02-26 DIAGNOSIS — R0789 Other chest pain: Secondary | ICD-10-CM | POA: Diagnosis not present

## 2021-02-26 DIAGNOSIS — K219 Gastro-esophageal reflux disease without esophagitis: Secondary | ICD-10-CM | POA: Diagnosis not present

## 2021-02-26 NOTE — Telephone Encounter (Signed)
See appt note from today

## 2021-02-26 NOTE — Assessment & Plan Note (Signed)
Suspect her swallowing/pressure chest pain is reflux related and already responding to omeprazole. Trial of 2-8 weeks depending on symptoms response.

## 2021-02-26 NOTE — Telephone Encounter (Signed)
Patient has appointment today with you.

## 2021-02-26 NOTE — Assessment & Plan Note (Signed)
Area of pain is over rib and reproducible. Discussed voltaren gel or ibuprofen if severe - but advised waiting until her other pain improves.

## 2021-02-26 NOTE — Progress Notes (Signed)
Subjective:     Diana Reyes is a 60 y.o. female presenting for Chest Pain (And pressure with gas and feels like something is stuck x 2 weeks. )     Chest Pain  This is a new problem. The current episode started 1 to 4 weeks ago. The problem occurs daily. Pertinent negatives include no cough or shortness of breath. She has tried antacids (allergy treatment) for the symptoms.   Hx of heartburn symptoms but not currently having same issues  Does feel like something is stuck and difficulty swallowing  Started omeprazole with improvement in symptoms  Pt notes 2 different chest symptoms  Weight is done - but notes she has not been eating as good this   The other chest pain is typically on the right side - worse with position changes and "stinging pain" which comes quickly and goes away quickly - 3 months of symptom - has not done anything to treat this symptom -     Review of Systems  Respiratory: Negative for cough and shortness of breath.   Cardiovascular: Positive for chest pain.     Social History   Tobacco Use  Smoking Status Never Smoker  Smokeless Tobacco Never Used        Objective:    BP Readings from Last 3 Encounters:  02/26/21 110/62  08/08/20 120/68  05/09/20 110/70   Wt Readings from Last 3 Encounters:  02/26/21 108 lb (49 kg)  08/08/20 110 lb (49.9 kg)  05/09/20 112 lb (50.8 kg)    BP 110/62   Pulse 84   Temp (!) 97.3 F (36.3 C) (Temporal)   Ht 5\' 2"  (1.575 m)   Wt 108 lb (49 kg)   SpO2 98%   BMI 19.75 kg/m    Physical Exam Constitutional:      General: She is not in acute distress.    Appearance: She is well-developed. She is not diaphoretic.  HENT:     Head: Normocephalic and atraumatic.     Right Ear: External ear normal.     Left Ear: External ear normal.     Nose: Nose normal.  Eyes:     Conjunctiva/sclera: Conjunctivae normal.  Cardiovascular:     Rate and Rhythm: Normal rate and regular rhythm.     Heart sounds: No  murmur heard.   Pulmonary:     Effort: Pulmonary effort is normal.     Breath sounds: Normal breath sounds.  Chest:     Chest wall: Tenderness (on the right chest wall in area of pain) present.  Abdominal:     General: Bowel sounds are normal.     Palpations: Abdomen is soft.     Tenderness: There is abdominal tenderness (epigastric).  Musculoskeletal:     Cervical back: Neck supple.  Skin:    General: Skin is warm and dry.     Capillary Refill: Capillary refill takes less than 2 seconds.  Neurological:     Mental Status: She is alert. Mental status is at baseline.  Psychiatric:        Mood and Affect: Mood normal.        Behavior: Behavior normal.           Assessment & Plan:   Problem List Items Addressed This Visit      Digestive   GERD (gastroesophageal reflux disease) - Primary    Suspect her swallowing/pressure chest pain is reflux related and already responding to omeprazole. Trial of 2-8 weeks depending on  symptoms response.         Other   Chest wall pain    Area of pain is over rib and reproducible. Discussed voltaren gel or ibuprofen if severe - but advised waiting until her other pain improves.           Return if symptoms worsen or fail to improve.  Lesleigh Noe, MD  This visit occurred during the SARS-CoV-2 public health emergency.  Safety protocols were in place, including screening questions prior to the visit, additional usage of staff PPE, and extensive cleaning of exam room while observing appropriate contact time as indicated for disinfecting solutions.

## 2021-02-26 NOTE — Patient Instructions (Signed)
#  Gas Bubble Chest pain - I think this is a form of acid reflux - take omeprazole 20 mg daily for at least 2 weeks - if symptoms go away can try stopping - take for up to 6-8 weeks - if unable to stop return   #Twisting pain - I think this is a muscle related pain - if severe can take ibuprofen - or can rub topical Voltaren Gel into the area that hurts  If either pain worsens return

## 2021-03-01 ENCOUNTER — Ambulatory Visit: Payer: 59 | Admitting: Family Medicine

## 2021-08-22 ENCOUNTER — Telehealth: Payer: Self-pay

## 2021-08-22 NOTE — Telephone Encounter (Signed)
Per appt notes; pt already has appt scheduled with Dr Einar Pheasant on 08/23/21 at 2:20. Per access note pt was given care advice for abd pain. Sending note to Dr Einar Pheasant who is out of office and [facility] CMA.

## 2021-08-22 NOTE — Telephone Encounter (Signed)
[facility] - Client TELEPHONE ADVICE RECORD [facility] Patient Name: Diana Reyes Gender: Female DOB: 07-03-1961 Age: 60 Y 10 M 27 D Return Phone Number: [phone] (Primary), [phone] (Secondary) Address: City/ State/ Zip: [place] [address] Client [facility] - Client Client Site [facility] - Day Physician Waunita Schooner- MD Contact Type Call Who Is Calling Patient / Member / Family / Caregiver Call Type Triage / Clinical Relationship To Patient Self Return Phone Number [phone] (Primary) Chief Complaint Abdominal Pain Reason for Call Symptomatic / Request for Diana Reyes states she is having abdominal pain (not sharp) comes and goes. (call was transferred by office) Translation No Nurse Assessment Nurse: D'Heur Lucia Gaskins, RN, Adrienne Date/Time (Eastern Time): 08/22/2021 1:36:05 PM Confirm and document reason for call. If symptomatic, describe symptoms. ---Caller states she is having mild abdominal pain (not sharp) intermittently. S/S started about 3-4 days ago. Pain started in lower abdomen in the middle. It has gone to the right side and then too the left. Last bowel movement was this morning. No fever. Does the patient have any new or worsening symptoms? ---Yes Will a triage be completed? ---Yes Related visit to physician within the last 2 weeks? ---No Does the PT have any chronic conditions? (i.e. diabetes, asthma, this includes High risk factors for pregnancy, etc.) ---Yes Is this a behavioral health or substance abuse call? ---No Guidelines Guideline Title Affirmed Question Affirmed Notes Nurse Date/Time (Eastern Time) Abdominal Pain - Female [1] MILD pain (e.g., does not interfere with normal activities) AND [2] pain comes and goes (cramps) AND [3] present > 48 hours (Exception: this same abdominal pain is a chronic  symptom D'Heur Lucia Gaskins, RN, Vincente Liberty 08/22/2021 1:39:53 PM PLEASE NOTE: All timestamps contained within this report are represented as [place] Standard Time. CONFIDENTIALTY NOTICE: This fax transmission is intended only for the addressee. It contains information that is legally privileged, confidential or otherwise protected from use or disclosure. If you are not the intended recipient, you are strictly prohibited from reviewing, disclosing, copying using or disseminating any of this information or taking any action in reliance on or regarding this information. If you have received this fax in error, please notify us immediately by telephone so that we can arrange for its return to [place]. Phone: [phone], Toll-Free: [phone], Fax: [phone] Page: 2 of 2 Call Id: [id number] Guidelines Guideline Title Affirmed Question Affirmed Notes Nurse Date/Time Eilene Ghazi Time) recurrent or ongoing AND present > 4 weeks) Disp. Time Eilene Ghazi Time) Disposition Final User 08/22/2021 1:45:41 PM See PCP within 24 Hours Yes D'Heur Lucia Gaskins, RN, Vincente Liberty Caller Disagree/Comply Comply Caller Understands Yes PreDisposition Call Doctor Care Advice Given Per Guideline SEE PCP WITHIN 24 HOURS: * IF OFFICE WILL BE OPEN: You need to be examined within the next 24 hours. Call your doctor (or NP/PA) when the office opens and make an appointment. DIET: * Drink adequate fluids. Eat a bland diet. * Avoid alcohol or caffeinated beverages * Avoid greasy or fatty foods. CALL BACK IF: * Severe pain lasts over 1 hour * Constant pain lasts over 2 hours * You become worse CARE ADVICE given per Abdominal Pain, Female (Adult) guideline. Comments User: Vincente Liberty, D'Heur Lucia Gaskins, RN Date/Time Eilene Ghazi Time): 08/22/2021 1:47:28 PM Warm transferred caller to office. Referrals REFERRED TO PCP OFFICE

## 2021-08-23 ENCOUNTER — Other Ambulatory Visit: Payer: Self-pay

## 2021-08-23 ENCOUNTER — Encounter: Payer: Self-pay | Admitting: Family Medicine

## 2021-08-23 ENCOUNTER — Ambulatory Visit (INDEPENDENT_AMBULATORY_CARE_PROVIDER_SITE_OTHER): Payer: 59 | Admitting: Family Medicine

## 2021-08-23 VITALS — BP 102/74 | HR 82 | Temp 97.9°F | Ht 62.0 in | Wt 109.0 lb

## 2021-08-23 DIAGNOSIS — G8929 Other chronic pain: Secondary | ICD-10-CM | POA: Diagnosis not present

## 2021-08-23 DIAGNOSIS — R1030 Lower abdominal pain, unspecified: Secondary | ICD-10-CM | POA: Insufficient documentation

## 2021-08-23 DIAGNOSIS — M545 Low back pain, unspecified: Secondary | ICD-10-CM | POA: Diagnosis not present

## 2021-08-23 DIAGNOSIS — M654 Radial styloid tenosynovitis [de Quervain]: Secondary | ICD-10-CM | POA: Diagnosis not present

## 2021-08-23 NOTE — Assessment & Plan Note (Signed)
Unable to exam today due to time, but notes >1 year hx. Advised trial of PT and referral placed.

## 2021-08-23 NOTE — Patient Instructions (Addendum)
May be diverticulitis or just constipation - increase water - consider stool softener - if worsening abdominal pain or fevers or chills -- call or go to the ER  Coffee - may increase heartburn, but not the cause of belly pain now  Thumb Spica Splint - for your wrists  - try voltaren gel   Back pain - would recommend physical therapy  #Referral I have placed a referral to a specialist for you. You should receive a phone call from the specialty office. Make sure your voicemail is not full and that if you are able to answer your phone to unknown or new numbers.   It may take up to 2 weeks to hear about the referral. If you do not hear anything in 2 weeks, please call our office and ask to speak with the referral coordinator.

## 2021-08-23 NOTE — Assessment & Plan Note (Signed)
Suspect this is the cause of wrist pain based on hx but unable to illicit on exam. Advised thumb spica and voltaren gel prn

## 2021-08-23 NOTE — Assessment & Plan Note (Signed)
Constipation vs mild diverticulitis - though changing sides favors constipation. Liquid diet and stool softener. F/u if not improving. Reassuring exam.

## 2021-08-23 NOTE — Progress Notes (Signed)
Subjective:     Diana Reyes is a 60 y.o. female presenting for Abdominal Pain (Says she has pain that will come in go and it's from lower mid abdominal pain then right to left. )     Abdominal Pain This is a new problem. The current episode started in the past 7 days. The onset quality is gradual. The problem occurs intermittently. The problem has been waxing and waning. The pain is located in the LLQ, RLQ and periumbilical region (every 16-10 seconds). The pain is at a severity of 3/10. The quality of the pain is cramping and dull. The abdominal pain does not radiate. Associated symptoms include belching. Pertinent negatives include no constipation, diarrhea, dysuria, flatus, headaches, hematochezia, hematuria, myalgias, nausea or vomiting. Exacerbated by: worse in afternoon. The pain is relieved by Bowel movements and certain positions. Treatments tried: rest, advil. The treatment provided moderate relief.   8 years ago had a diverticulitis - so she started eating more veggies and water  Joint pain - bilateral wrists - radial side "mother's wrist" worse with thumb bent in fist - also has some low back pain from injury 1.5 years ago - does not go away   Review of Systems  Gastrointestinal:  Positive for abdominal pain. Negative for constipation, diarrhea, flatus, hematochezia, nausea and vomiting.  Genitourinary:  Negative for dysuria and hematuria.  Musculoskeletal:  Negative for myalgias.  Neurological:  Negative for headaches.    Social History   Tobacco Use  Smoking Status Never  Smokeless Tobacco Never        Objective:    BP Readings from Last 3 Encounters:  08/23/21 102/74  02/26/21 110/62  08/08/20 120/68   Wt Readings from Last 3 Encounters:  08/23/21 109 lb (49.4 kg)  02/26/21 108 lb (49 kg)  08/08/20 110 lb (49.9 kg)    BP 102/74   Pulse 82   Temp 97.9 F (36.6 C) (Temporal)   Ht 5\' 2"  (1.575 m)   Wt 109 lb (49.4 kg)   SpO2 98%   BMI 19.94 kg/m     Physical Exam Constitutional:      General: She is not in acute distress.    Appearance: She is well-developed. She is not diaphoretic.  HENT:     Right Ear: External ear normal.     Left Ear: External ear normal.     Nose: Nose normal.  Eyes:     Conjunctiva/sclera: Conjunctivae normal.  Cardiovascular:     Rate and Rhythm: Normal rate and regular rhythm.     Heart sounds: No murmur heard. Pulmonary:     Effort: Pulmonary effort is normal. No respiratory distress.     Breath sounds: Normal breath sounds. No wheezing.  Abdominal:     General: Abdomen is flat. Bowel sounds are normal. There is no distension.     Palpations: Abdomen is soft.     Tenderness: There is abdominal tenderness in the right lower quadrant and left lower quadrant. There is no guarding or rebound.  Musculoskeletal:     Cervical back: Neck supple.     Comments: Hands:  Finklestein is negative - however, pt notes this does typically cause pain in the radial wrist  Skin:    General: Skin is warm and dry.     Capillary Refill: Capillary refill takes less than 2 seconds.  Neurological:     Mental Status: She is alert. Mental status is at baseline.  Psychiatric:  Mood and Affect: Mood normal.        Behavior: Behavior normal.          Assessment & Plan:   Problem List Items Addressed This Visit       Musculoskeletal and Integument   De Quervain's syndrome (tenosynovitis)    Suspect this is the cause of wrist pain based on hx but unable to illicit on exam. Advised thumb spica and voltaren gel prn        Other   Lower abdominal pain - Primary    Constipation vs mild diverticulitis - though changing sides favors constipation. Liquid diet and stool softener. F/u if not improving. Reassuring exam.       Chronic right-sided low back pain without sciatica    Unable to exam today due to time, but notes >1 year hx. Advised trial of PT and referral placed.       Relevant Orders   Ambulatory  referral to Physical Therapy     Return if symptoms worsen or fail to improve.  Lesleigh Noe, MD  This visit occurred during the SARS-CoV-2 public health emergency.  Safety protocols were in place, including screening questions prior to the visit, additional usage of staff PPE, and extensive cleaning of exam room while observing appropriate contact time as indicated for disinfecting solutions.

## 2021-08-23 NOTE — Telephone Encounter (Signed)
See note from today

## 2021-09-27 ENCOUNTER — Other Ambulatory Visit (HOSPITAL_COMMUNITY)
Admission: RE | Admit: 2021-09-27 | Discharge: 2021-09-27 | Disposition: A | Discharge status: Home / Self Care | Payer: 59 | Source: Ambulatory Visit | Attending: Family Medicine | Admitting: Family Medicine

## 2021-09-27 ENCOUNTER — Encounter: Payer: Self-pay | Admitting: Family Medicine

## 2021-09-27 ENCOUNTER — Ambulatory Visit (INDEPENDENT_AMBULATORY_CARE_PROVIDER_SITE_OTHER): Payer: 59 | Admitting: Family Medicine

## 2021-09-27 ENCOUNTER — Other Ambulatory Visit: Payer: Self-pay

## 2021-09-27 VITALS — BP 100/70 | HR 75 | Temp 96.1°F | Ht 61.9 in | Wt 109.0 lb

## 2021-09-27 DIAGNOSIS — Z1231 Encounter for screening mammogram for malignant neoplasm of breast: Secondary | ICD-10-CM

## 2021-09-27 DIAGNOSIS — Z1159 Encounter for screening for other viral diseases: Secondary | ICD-10-CM | POA: Diagnosis not present

## 2021-09-27 DIAGNOSIS — Z124 Encounter for screening for malignant neoplasm of cervix: Secondary | ICD-10-CM | POA: Insufficient documentation

## 2021-09-27 DIAGNOSIS — E782 Mixed hyperlipidemia: Secondary | ICD-10-CM | POA: Diagnosis not present

## 2021-09-27 DIAGNOSIS — Z0000 Encounter for general adult medical examination without abnormal findings: Secondary | ICD-10-CM | POA: Diagnosis not present

## 2021-09-27 DIAGNOSIS — Z114 Encounter for screening for human immunodeficiency virus [HIV]: Secondary | ICD-10-CM

## 2021-09-27 LAB — LIPID PANEL
Cholesterol: 252 mg/dL — ABNORMAL HIGH (ref 0–200)
HDL: 59.9 mg/dL (ref >39.00)
LDL Cholesterol: 152 mg/dL — ABNORMAL HIGH (ref 0–99)
NonHDL: 191.78
Total CHOL/HDL Ratio: 4
Triglycerides: 198 mg/dL — ABNORMAL HIGH (ref 0.0–149.0)
VLDL: 39.6 mg/dL (ref 0.0–40.0)

## 2021-09-27 LAB — COMPREHENSIVE METABOLIC PANEL
ALT: 35 U/L (ref 0–35)
AST: 31 U/L (ref 0–37)
Albumin: 4.5 g/dL (ref 3.5–5.2)
Alkaline Phosphatase: 66 U/L (ref 39–117)
BUN: 16 mg/dL (ref 6–23)
CO2: 30 mEq/L (ref 19–32)
Calcium: 9.5 mg/dL (ref 8.4–10.5)
Chloride: 104 mEq/L (ref 96–112)
Creatinine, Ser: 0.67 mg/dL (ref 0.40–1.20)
GFR: 95.95 mL/min (ref >60.00)
Glucose, Bld: 93 mg/dL (ref 70–99)
Potassium: 4.7 mEq/L (ref 3.5–5.1)
Sodium: 141 mEq/L (ref 135–145)
Total Bilirubin: 0.5 mg/dL (ref 0.2–1.2)
Total Protein: 6.8 g/dL (ref 6.0–8.3)

## 2021-09-27 LAB — CBC
HCT: 42.8 % (ref 36.0–46.0)
Hemoglobin: 13.9 g/dL (ref 12.0–15.0)
MCHC: 32.5 g/dL (ref 30.0–36.0)
MCV: 89.1 fl (ref 78.0–100.0)
Platelets: 249 10*3/uL (ref 150.0–400.0)
RBC: 4.81 Mil/uL (ref 3.87–5.11)
RDW: 12.7 % (ref 11.5–15.5)
WBC: 6.8 10*3/uL (ref 4.0–10.5)

## 2021-09-27 NOTE — Progress Notes (Signed)
Annual Exam   Chief Complaint:  Chief Complaint  Patient presents with   Annual Exam    Needs order for mammogram. Pt will go to [place] Exam    Pt can not remember where her last pap was done. Is prepared to do one today.     History of Present Illness:  Diana Reyes is a 60 y.o. [id number] who LMP was No LMP recorded. Patient is postmenopausal., presents today for her annual examination.     Nutrition She does get adequate calcium and Vitamin D in her diet. Diet: healthy - limits amount Exercise: 30 minutes walking  Safety The patient wears seatbelts: yes.     The patient feels safe at home and in their relationships: yes.  Dentist: needs to find one Eye: 1 year ago  Menstrual:  Symptoms of menopause: hot flashes LMP 2017  GYN She is not sexually active.    Cervical Cancer Screening (21-65):   Last Pap:  unsure  Breast Cancer Screening (Age 83-74):  There is no FH of breast cancer. There is no FH of ovarian cancer. BRCA screening Not Indicated.  Last Mammogram: 2017 The patient does want a mammogram this year.    Colon Cancer Screening:  Age 73-75 yo - benefits outweigh the risk. Adults 38-85 yo who have never been screened benefit.  Benefits: 134000 people in 2016 will be diagnosed and 49,000 will die - early detection helps [place]: Complications 2/2 to colonoscopy High Risk (Colonoscopy): genetic disorder (Lynch syndrome or familial adenomatous polyposis), personal hx of IBD, previous adenomatous polyp, or previous colorectal cancer, FamHx start 10 years before the age at diagnosis, increased in males and black race  Options:  FIT - looks for hemoglobin (blood in the stool) - specific and fairly sensitive - must be done annually Cologuard - looks for DNA and blood - more sensitive - therefore can have more false positives, every 3 years Colonoscopy - every 10 years if normal - sedation, bowl prep, must have someone drive you  Shared decision  making and the patient had decided to do colonscopy 2026 next due.   Social History   Tobacco Use  Smoking Status Never  Smokeless Tobacco Never    Lung Cancer Screening (Ages 89-16): not applicable   Weight Wt Readings from Last 3 Encounters:  09/27/21 109 lb (49.4 kg)  08/23/21 109 lb (49.4 kg)  02/26/21 108 lb (49 kg)   Patient has normal BMI  BMI Readings from Last 1 Encounters:  09/27/21 20.00 kg/m     Chronic disease screening Blood pressure monitoring:  BP Readings from Last 3 Encounters:  09/27/21 100/70  08/23/21 102/74  02/26/21 110/62    Lipid Monitoring: Indication for screening: age >41, obesity, diabetes, family hx, CV risk factors.  Lipid screening: Yes  No results found for: CHOL, HDL, LDLCALC, LDLDIRECT, TRIG, CHOLHDL   Diabetes Screening: age >52, overweight, family hx, PCOS, hx of gestational diabetes, at risk ethnicity Diabetes Screening screening: Yes  No results found for: HGBA1C   Past Medical History:  Diagnosis Date   Acute pain of left shoulder 04/09/2016   Adhesive capsulitis of left shoulder 10/09/2016   Chronic GERD    Migraines     Past Surgical History:  Procedure Laterality Date   TOOTH EXTRACTION      Prior to Admission medications   Medication Sig Start Date End Date Taking? Authorizing Provider  Calcium Carb-Cholecalciferol (CALCIUM/VITAMIN D PO) Take by mouth.   Yes  [provider]  cetirizine (ZYRTEC) 10 MG tablet Take by mouth.   Yes [provider]  ibuprofen (ADVIL) 600 MG tablet Take 600 mg by mouth as needed.   Yes [provider]  Multiple Vitamin (MULTI-VITAMIN) tablet Take 1 tablet by mouth daily.   Yes [provider]  omeprazole (PRILOSEC) 40 MG capsule Take by mouth as needed. 04/11/16  Yes [provider]    No Known Allergies  Gynecologic History: No LMP recorded. Patient is postmenopausal.  Obstetric History: [id number]  Social History   Socioeconomic  History   Marital status: Married    Spouse name: Jeris Penta   Number of children: 2   Years of education: college   Highest education level: Not on file  Occupational History   Not on file  Tobacco Use   Smoking status: Never   Smokeless tobacco: Never  Vaping Use   Vaping Use: Never used  Substance and Sexual Activity   Alcohol use: Yes    Comment: 1-2 times a week, less than a serving size   Drug use: Never   Sexual activity: Not Currently    Partners: Male    Birth control/protection: None  Other Topics Concern   Not on file  Social History Narrative   05/09/20   From: [place], moved to [place] to be near family in 2007   Living: with husband Jeris Penta (1991) and daughter Gwenette Greet   Work: retired Equities trader from [place]      Family: Mitzi Hansen (lives in [place]) and daughter (maureen, unc-g)      Enjoys: reading bible, crochet, watch tv      Exercise: walking 10-20 minutes daily (dog walking, minipoodle)   Diet: pretty healthy, balanaced      Safety   Seat belts: Yes    Guns: Yes  and secure   Safe in relationships: Yes    Social Determinants of Health   Financial Resource Strain: Not on file  Food Insecurity: Not on file  Transportation Needs: Not on file  Physical Activity: Not on file  Stress: Not on file  Social Connections: Not on file  Intimate Partner Violence: Not on file    Family History  Problem Relation Age of Onset   Stroke Mother    Heart disease Father    Asthma Daughter    Asthma Maternal Grandmother    Hearing loss Maternal Grandfather    Pancreatic cancer Maternal Grandfather     Review of Systems  Constitutional:  Negative for chills and fever.  HENT:  Negative for congestion and sore throat.   Eyes:  Negative for blurred vision and double vision.  Respiratory:  Negative for shortness of breath.   Cardiovascular:  Negative for chest pain.  Gastrointestinal:  Positive for heartburn. Negative for nausea and vomiting.   Genitourinary: Negative.   Musculoskeletal: Negative.  Negative for myalgias.  Skin:  Negative for rash.  Neurological:  Positive for headaches. Negative for dizziness.  Endo/Heme/Allergies:  Does not bruise/bleed easily.  Psychiatric/Behavioral:  Negative for depression. The patient is not nervous/anxious.     Physical Exam BP 100/70   Pulse 75   Temp (!) 96.1 F (35.6 C) (Temporal)   Ht 5' 1.9" (1.572 m)   Wt 109 lb (49.4 kg)   SpO2 98%   BMI 20.00 kg/m    BP Readings from Last 3 Encounters:  09/27/21 100/70  08/23/21 102/74  02/26/21 110/62      Physical Exam Exam conducted with a  chaperone present.  Constitutional:      General: She is not in acute distress.    Appearance: She is well-developed. She is not diaphoretic.  HENT:     Head: Normocephalic and atraumatic.     Right Ear: External ear normal.     Left Ear: External ear normal.     Nose: Nose normal.  Eyes:     General: No scleral icterus.    Extraocular Movements: Extraocular movements intact.     Conjunctiva/sclera: Conjunctivae normal.  Cardiovascular:     Rate and Rhythm: Normal rate and regular rhythm.     Heart sounds: No murmur heard. Pulmonary:     Effort: Pulmonary effort is normal. No respiratory distress.     Breath sounds: Normal breath sounds. No wheezing.  Abdominal:     General: Bowel sounds are normal. There is no distension.     Palpations: Abdomen is soft. There is no mass.     Tenderness: There is no abdominal tenderness. There is no guarding or rebound.  Genitourinary:    Vagina: Normal.     Cervix: Normal.  Musculoskeletal:        General: Normal range of motion.     Cervical back: Neck supple.  Lymphadenopathy:     Cervical: No cervical adenopathy.  Skin:    General: Skin is warm and dry.     Capillary Refill: Capillary refill takes less than 2 seconds.  Neurological:     Mental Status: She is alert and oriented to person, place, and time.     Deep Tendon Reflexes:  Reflexes normal.  Psychiatric:        Mood and Affect: Mood normal.        Behavior: Behavior normal.    Results:  PHQ-9:  [place] Office Visit from 02/26/2021 in [place] at [place]  PHQ-9 Total Score 5         Assessment: 60 y.o. [id number] female here for routine annual physical examination.  Plan: Problem List Items Addressed This Visit   None Visit Diagnoses     Annual physical exam    -  Primary   Relevant Orders   Comprehensive metabolic panel   Lipid panel   CBC   Encounter for screening mammogram for malignant neoplasm of breast       Relevant Orders   MM Digital Screening   Mixed hyperlipidemia       Relevant Orders   Lipid panel   Need for hepatitis C screening test       Relevant Orders   Hepatitis C antibody   Encounter for screening for HIV       Relevant Orders   HIV Antibody (routine testing w rflx)   Cervical cancer screening       Relevant Orders   Cytology - PAP       Screening: -- Blood pressure screen normal -- cholesterol screening: will obtain -- Weight screening: normal -- Diabetes Screening: will obtain -- Nutrition: Encouraged healthy diet  The ASCVD Risk score (Arnett DK, et al., 2019) failed to calculate for the following reasons:   Cannot find a previous HDL lab   Cannot find a previous total cholesterol lab  -- Statin therapy for Age 11-75 with CVD risk >7.5%  Psych -- Depression screening (PHQ-9):  [place] Office Visit from 02/26/2021 in [place] at [facility]  PHQ-9 Total Score 5        Safety -- tobacco screening: not using -- alcohol  screening:  low-risk usage. -- no evidence of domestic violence or intimate partner violence.   Cancer Screening -- pap smear collected per ASCCP guidelines -- family history of breast cancer screening: done. not at high risk. -- Mammogram -  ordered and number provided -- Colon cancer (age 63+)--  up to date  Immunizations Immunization  History  Administered Date(s) Administered   Influenza,inj,Quad PF,6+ Mos 08/24/2015, 08/31/2016, 09/11/2017, 08/26/2018, 09/20/2021   Influenza-Unspecified 08/24/2015   PFIZER(Purple Top)SARS-COV-2 Vaccination 03/01/2020, 03/28/2020, 11/06/2020   Tdap 04/05/2015    -- flu vaccine up to date -- TDAP q10 years up to date -- Shingles (age >32) not up to date - declined today -- Covid-19 Vaccine up to date   Encouraged healthy diet and exercise. Encouraged regular vision and dental care.    Lesleigh Noe, MD

## 2021-09-27 NOTE — Patient Instructions (Addendum)
You can call for a mammogram at these locations:  [facility] at [facility].  [place] healthy diet and exercise  Schedule Nurse visit for Shingles vaccine  Recommend the following for Bone health  1) 800 units of Vitamin D daily 2) Get 1200 mg of elemental calcium --- this is best from your diet. Try to track how much calcium you get on a typical day. You could find ways to add more (dairy products, leafy greens). Take a supplement for whatever you don't typically get so you reach 1200 mg of calcium.  3) Physical activity (ideally weight bearing) - like walking briskly 30 minutes 5 days a week.

## 2021-09-28 LAB — HEPATITIS C ANTIBODY
Hepatitis C Ab: NONREACTIVE
SIGNAL TO CUT-OFF: 0.05 (ref <1.00)

## 2021-09-28 LAB — HIV ANTIBODY (ROUTINE TESTING W REFLEX): HIV 1&2 Ab, 4th Generation: NONREACTIVE

## 2021-10-02 LAB — CYTOLOGY - PAP
Comment: NEGATIVE
Diagnosis: NEGATIVE
High risk HPV: NEGATIVE

## 2021-10-02 IMAGING — CT CT RENAL STONE PROTOCOL
2 of 4 series · 16 of 46 positions shown, 18 images · non-contrast
Comparison: None.

CLINICAL DATA: Flank pain with kidney stone suspected

EXAM:
CT ABDOMEN AND PELVIS WITHOUT CONTRAST
TECHNIQUE: Multidetector CT imaging of the abdomen and pelvis was performed
following the standard protocol without IV contrast.

[Series 2: stone full standard · axial · 0.68mm/px · z∈[-456,-36]mm · 13 of 92 slices shown, 15 images]
[im 4/92  soft-tissue]
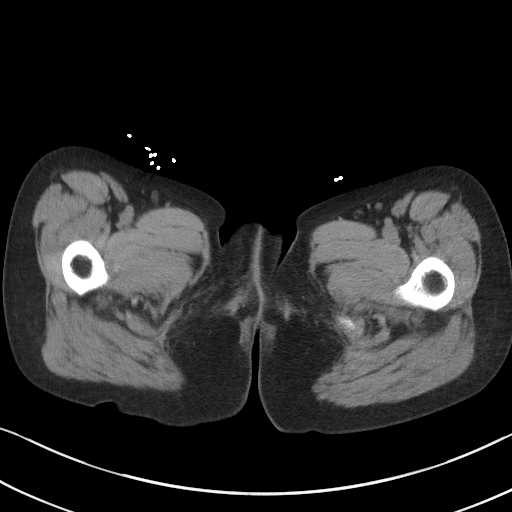
[im 4/92  bone]
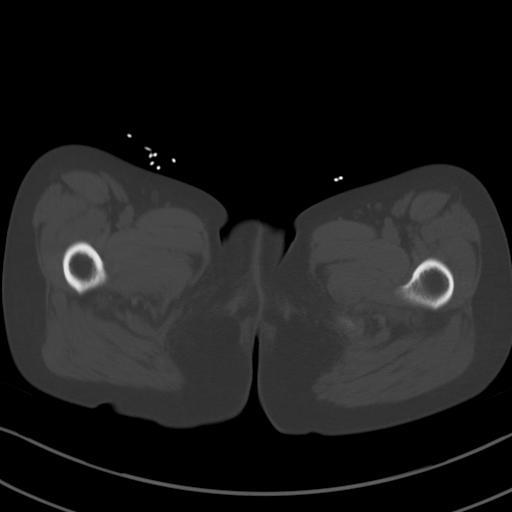
[im 12/92  soft-tissue]
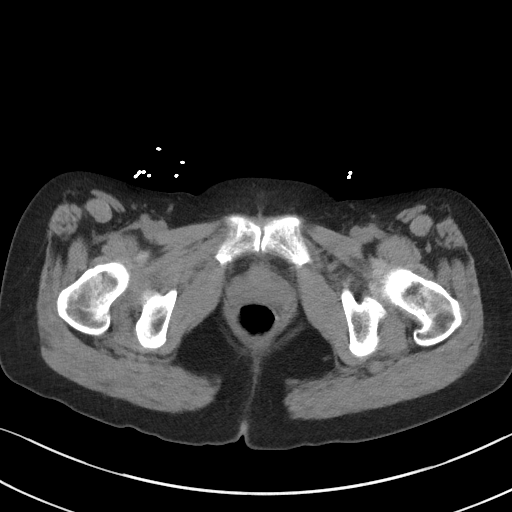
[im 19/92  soft-tissue]
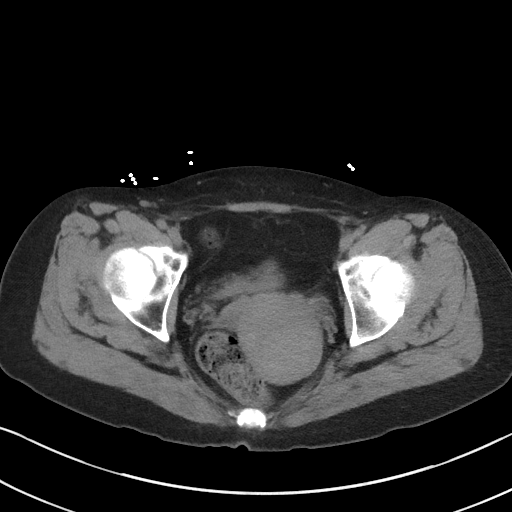
[im 27/92  soft-tissue]
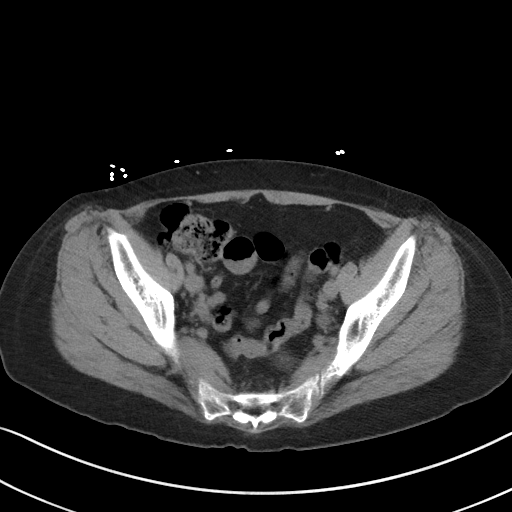
[im 31/92  soft-tissue]
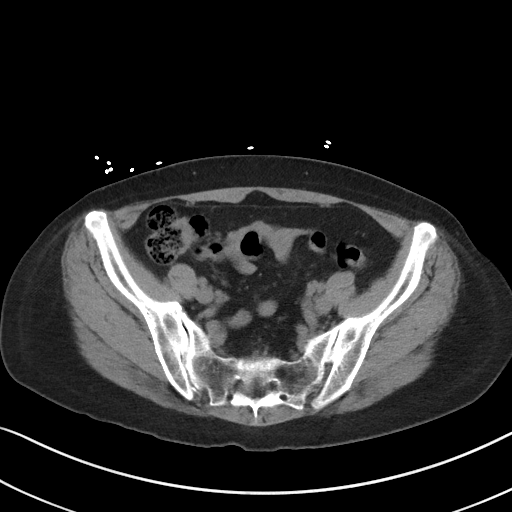
[im 38/92  soft-tissue]
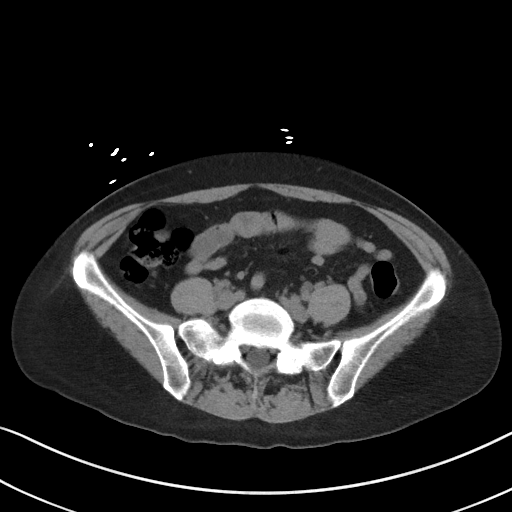
[im 46/92  soft-tissue]
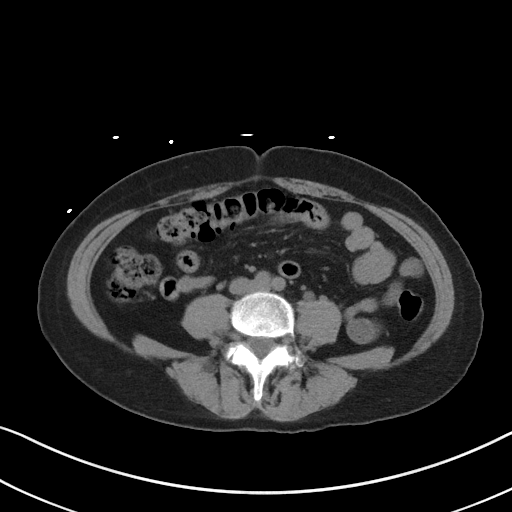
[im 54/92  soft-tissue]
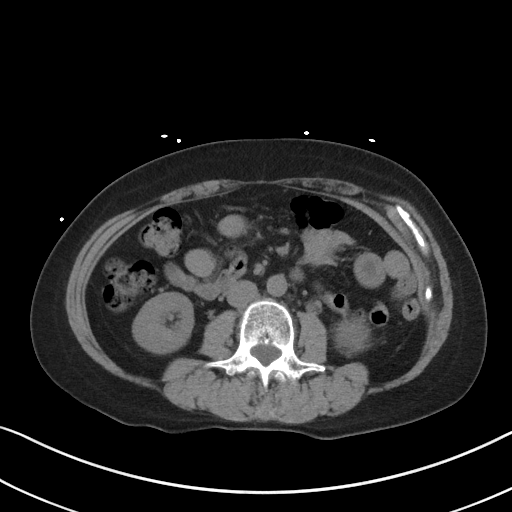
[im 61/92  soft-tissue]
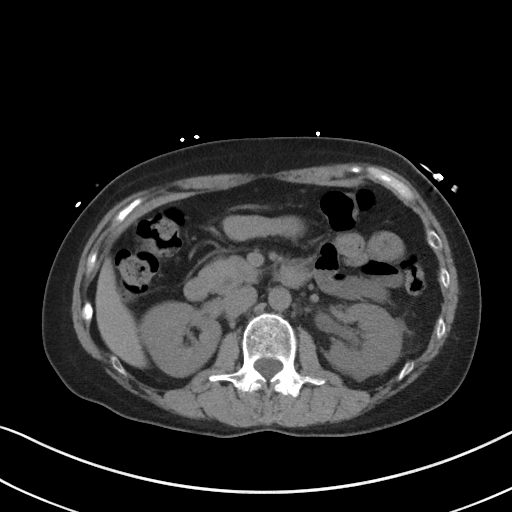
[im 61/92  bone]
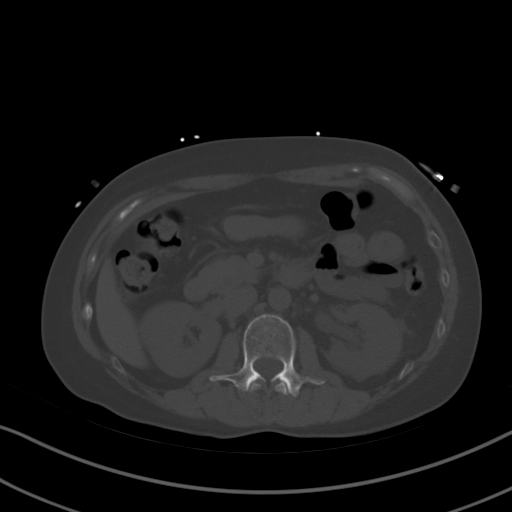
[im 65/92  soft-tissue]
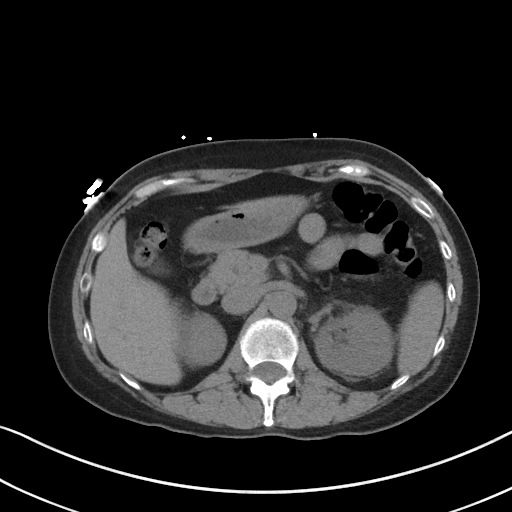
[im 73/92  soft-tissue]
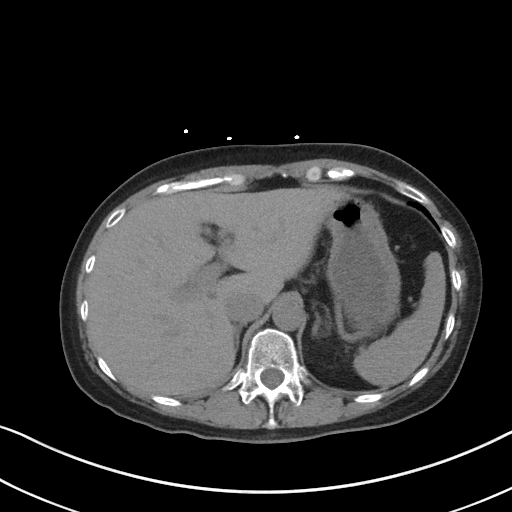
[im 80/92  soft-tissue]
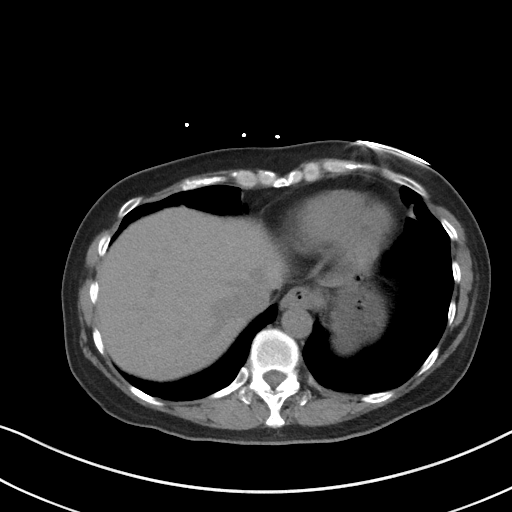
[im 88/92  soft-tissue]
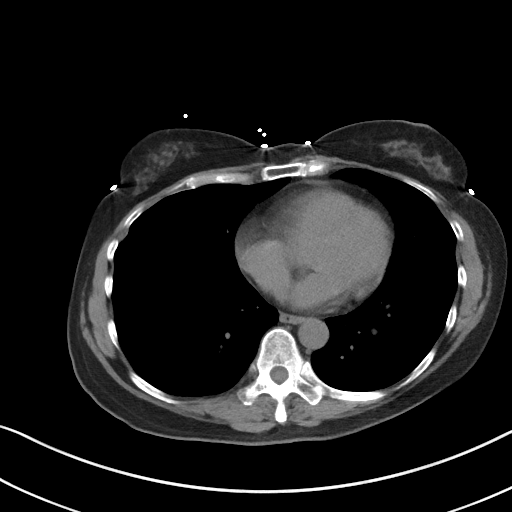

[Series 5: coronal · coronal · 0.72mm/px · 3 of 109 slices shown]
[im 37/109  soft-tissue]
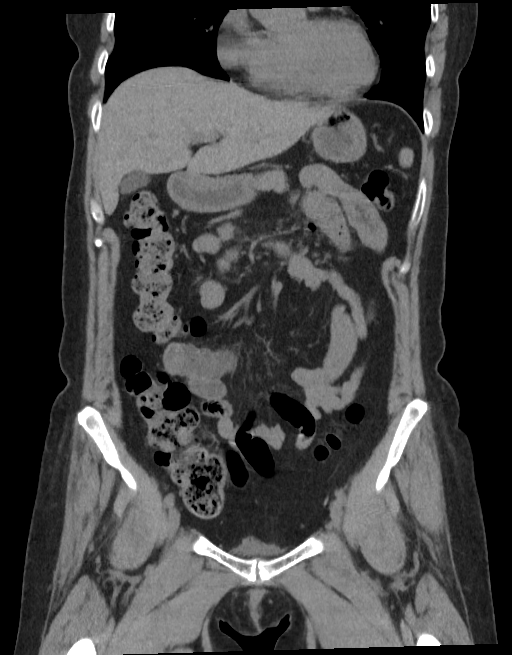
[im 49/109  soft-tissue]
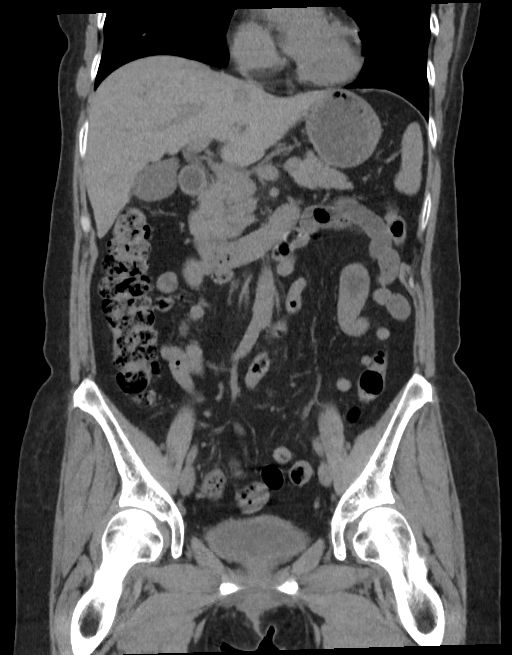
[im 61/109  soft-tissue]
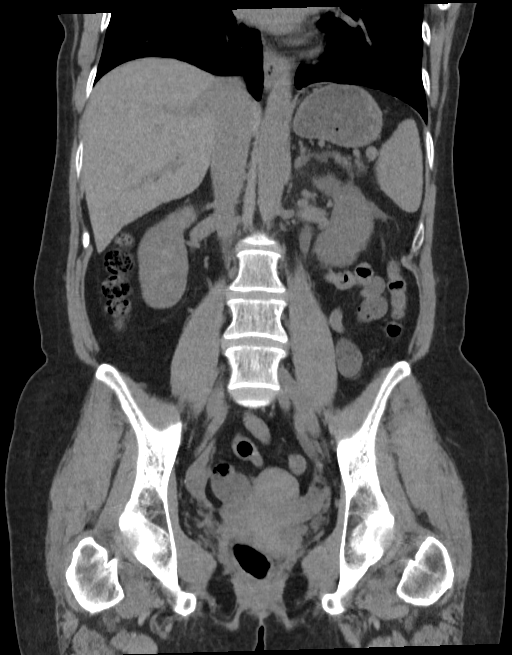

[16 of 46 positions shown; findings below may reference images not displayed]

FINDINGS: Lower chest:  No contributory findings.

Hepatobiliary: No focal liver abnormality.No evidence of biliary
obstruction or stone.

Pancreas: Unremarkable.

Spleen: Unremarkable.

Adrenals/Urinary Tract: Negative adrenals. Left
hydroureteronephrosis and perinephric stranding due to a 4 x 3 mm
stone at the UVJ. No additional urolithiasis. Unremarkable bladder.

Stomach/Bowel:  No obstruction. Negative for bowel inflammation.

Vascular/Lymphatic: No acute vascular abnormality. No mass or
adenopathy.

Reproductive:I soda hyperdense uterine masses with lobulated serosa.
The most discrete is anterior subserosal at 3.3 cm.

Other: No ascites or pneumoperitoneum.

Musculoskeletal: No acute abnormalities.
IMPRESSION: 1. Left hydroureteronephrosis from a 4 x 3 mm UVJ calculus.
2. Fibroid uterus.
